# Patient Record
Sex: Female | Born: 1980 | Race: White | Hispanic: No | Marital: Single | Smoking: Former smoker
Health system: Southern US, Community
[De-identification: ages and names within clinical notes are randomized; demographics above are authoritative.]

## PROBLEM LIST (undated history)

## (undated) DIAGNOSIS — E282 Polycystic ovarian syndrome: Secondary | ICD-10-CM

## (undated) DIAGNOSIS — N83519 Torsion of ovary and ovarian pedicle, unspecified side: Secondary | ICD-10-CM

---

## 2009-07-03 DIAGNOSIS — N83519 Torsion of ovary and ovarian pedicle, unspecified side: Secondary | ICD-10-CM

## 2009-07-03 DIAGNOSIS — E282 Polycystic ovarian syndrome: Secondary | ICD-10-CM

## 2009-07-03 HISTORY — PX: OTHER SURGICAL HISTORY: SHX169

## 2009-07-03 HISTORY — DX: Torsion of ovary and ovarian pedicle, unspecified side: N83.519

## 2009-07-03 HISTORY — DX: Polycystic ovarian syndrome: E28.2

## 2017-10-22 ENCOUNTER — Inpatient Hospital Stay (HOSPITAL_COMMUNITY)
Admission: AD | Admit: 2017-10-22 | Discharge: 2017-10-22 | Disposition: A | Discharge status: Home / Self Care | Payer: Federal, State, Local not specified - PPO | Source: Ambulatory Visit | Attending: Obstetrics & Gynecology | Admitting: Obstetrics & Gynecology

## 2017-10-22 ENCOUNTER — Inpatient Hospital Stay (HOSPITAL_COMMUNITY): Payer: Federal, State, Local not specified - PPO

## 2017-10-22 ENCOUNTER — Encounter (HOSPITAL_COMMUNITY): Payer: Self-pay

## 2017-10-22 DIAGNOSIS — Z79899 Other long term (current) drug therapy: Secondary | ICD-10-CM | POA: Diagnosis not present

## 2017-10-22 DIAGNOSIS — N939 Abnormal uterine and vaginal bleeding, unspecified: Secondary | ICD-10-CM | POA: Diagnosis not present

## 2017-10-22 DIAGNOSIS — Z87891 Personal history of nicotine dependence: Secondary | ICD-10-CM | POA: Insufficient documentation

## 2017-10-22 DIAGNOSIS — N83202 Unspecified ovarian cyst, left side: Secondary | ICD-10-CM

## 2017-10-22 DIAGNOSIS — R102 Pelvic and perineal pain: Secondary | ICD-10-CM | POA: Diagnosis not present

## 2017-10-22 DIAGNOSIS — Z7984 Long term (current) use of oral hypoglycemic drugs: Secondary | ICD-10-CM | POA: Diagnosis not present

## 2017-10-22 DIAGNOSIS — N83292 Other ovarian cyst, left side: Secondary | ICD-10-CM | POA: Diagnosis not present

## 2017-10-22 DIAGNOSIS — K5909 Other constipation: Secondary | ICD-10-CM | POA: Diagnosis not present

## 2017-10-22 DIAGNOSIS — Z8742 Personal history of other diseases of the female genital tract: Secondary | ICD-10-CM

## 2017-10-22 DIAGNOSIS — R109 Unspecified abdominal pain: Secondary | ICD-10-CM | POA: Diagnosis present

## 2017-10-22 HISTORY — DX: Polycystic ovarian syndrome: E28.2

## 2017-10-22 HISTORY — DX: Torsion of ovary and ovarian pedicle, unspecified side: N83.519

## 2017-10-22 LAB — WET PREP, GENITAL
Sperm: NONE SEEN
TRICH WET PREP: NONE SEEN
Yeast Wet Prep HPF POC: NONE SEEN

## 2017-10-22 LAB — URINALYSIS, ROUTINE W REFLEX MICROSCOPIC
BILIRUBIN URINE: NEGATIVE
Glucose, UA: NEGATIVE mg/dL
KETONES UR: NEGATIVE mg/dL
NITRITE: NEGATIVE
PROTEIN: NEGATIVE mg/dL
Specific Gravity, Urine: 1.006 (ref 1.005–1.030)
pH: 6 (ref 5.0–8.0)

## 2017-10-22 LAB — CBC
HCT: 39.3 % (ref 36.0–46.0)
HEMOGLOBIN: 13.6 g/dL (ref 12.0–15.0)
MCH: 30.8 pg (ref 26.0–34.0)
MCHC: 34.6 g/dL (ref 30.0–36.0)
MCV: 88.9 fL (ref 78.0–100.0)
PLATELETS: 211 10*3/uL (ref 150–400)
RBC: 4.42 MIL/uL (ref 3.87–5.11)
RDW: 12.1 % (ref 11.5–15.5)
WBC: 7.5 10*3/uL (ref 4.0–10.5)

## 2017-10-22 LAB — POCT PREGNANCY, URINE: PREG TEST UR: NEGATIVE

## 2017-10-22 MED ORDER — TRAMADOL HCL 50 MG PO TABS
100.0000 mg | ORAL_TABLET | Freq: Once | ORAL | Status: AC
  Administered 2017-10-22: 100 mg via ORAL
  Filled 2017-10-22: qty 2

## 2017-10-22 MED ORDER — IBUPROFEN 600 MG PO TABS: 600.0000 mg | ORAL_TABLET | Freq: Four times a day (QID) | ORAL | 1 refills | Status: AC | PRN

## 2017-10-22 MED ORDER — TRAMADOL HCL 50 MG PO TABS: 50.0000 mg | ORAL_TABLET | Freq: Four times a day (QID) | ORAL | 0 refills | Status: AC | PRN

## 2017-10-22 NOTE — MAU Provider Note (Signed)
Chief Complaint: Abdominal Pain   First Provider Initiated Contact with Patient 10/22/17 1550      SUBJECTIVE HPI: Kayla Fuller is a 37 y.o. who presents to maternity admissions reporting vaginal bleeding 2 weeks after her normal period that is associated with severe lower abdominal cramping. She reports her usual regular period was 2 weeks ago, then yesterday she started having light period bleeding again and severe cramping intermittent lower abdominal pain. The pain is more on the left lower side but is all over her low abdomen. She took ibuprofen, 7-8 tablets, today but with no pain relief. She has hx of PCOS with ovary removed 9-10 years ago for torsion.  After her ovary was removed, her periods became regular and she has not had any other gyn problems. She reports some chronic constipation but has never had pain like this. Last bowel movement was today but it was hard and uncomfortable.  There are no other associated symptoms. Pertinent negatives include no n/v, no fever/chills. She has not tried any other treatments.   HPI  Past Medical History:  Diagnosis Date  . Ovarian torsion 2011   right  . PCOS (polycystic ovarian syndrome) 2011   Past Surgical History:  Procedure Laterality Date  . removal of right ovary and fallopian tube Right 2011   Social History   Socioeconomic History  . Marital status: Single    Spouse name: Not on file  . Number of children: Not on file  . Years of education: Not on file  . Highest education level: Not on file  Occupational History  . Not on file  Social Needs  . Financial resource strain: Not on file  . Food insecurity:    Worry: Not on file    Inability: Not on file  . Transportation needs:    Medical: Not on file    Non-medical: Not on file  Tobacco Use  . Smoking status: Former Games developer  . Smokeless tobacco: Never Used  Substance and Sexual Activity  . Alcohol use: Yes    Comment: socially  . Drug use: Never  . Sexual activity:  Yes    Birth control/protection: Condom  Lifestyle  . Physical activity:    Days per week: Not on file    Minutes per session: Not on file  . Stress: Not on file  Relationships  . Social connections:    Talks on phone: Not on file    Gets together: Not on file    Attends religious service: Not on file    Active member of club or organization: Not on file    Attends meetings of clubs or organizations: Not on file    Relationship status: Not on file  . Intimate partner violence:    Fear of current or ex partner: Not on file    Emotionally abused: Not on file    Physically abused: Not on file    Forced sexual activity: Not on file  Other Topics Concern  . Not on file  Social History Narrative  . Not on file   No current facility-administered medications on file prior to encounter.    Current Outpatient Medications on File Prior to Encounter  Medication Sig Dispense Refill  . albuterol (PROVENTIL HFA;VENTOLIN HFA) 108 (90 Base) MCG/ACT inhaler Inhale 1-2 puffs into the lungs every 6 (six) hours as needed for wheezing or shortness of breath.    Marland Kitchen ibuprofen (ADVIL,MOTRIN) 200 MG tablet Take 200 mg by mouth every 6 (six) hours as needed  for headache or cramping.    . metFORMIN (GLUCOPHAGE) 1000 MG tablet Take 500 mg by mouth 2 (two) times daily with a meal.    . sertraline (ZOLOFT) 100 MG tablet Take 100 mg by mouth daily.     No Known Allergies  ROS:  Review of Systems  Constitutional: Negative for chills, fatigue and fever.  Respiratory: Negative for shortness of breath.   Cardiovascular: Negative for chest pain.  Gastrointestinal: Positive for abdominal pain and constipation. Negative for diarrhea, nausea and vomiting.  Genitourinary: Positive for pelvic pain and vaginal bleeding. Negative for difficulty urinating, dysuria, flank pain, vaginal discharge and vaginal pain.  Neurological: Negative for dizziness and headaches.  Psychiatric/Behavioral: Negative.      I have  reviewed patient's Past Medical Hx, Surgical Hx, Family Hx, Social Hx, medications and allergies.   Physical Exam   Patient Vitals for the past 24 hrs:  BP Temp Temp src Pulse Resp SpO2 Height Weight  10/22/17 1315 121/83 99 F (37.2 C) Oral 84 16 98 % 5' (1.524 m) 208 lb (94.3 kg)   Constitutional: Well-developed, well-nourished female in mild distress.  Cardiovascular: normal rate Respiratory: normal effort GI: Abd soft, non-tender. No rebound tenderness or guarding. Pos BS x 4 MS: Extremities nontender, no edema, normal ROM Neurologic: Alert and oriented x 4.  GU: Neg CVAT.  PELVIC EXAM: Cervix pink, visually closed, without lesion, small amount dark red bleeding on exam, vaginal walls and external genitalia normal Bimanual exam: Cervix 0/long/high, firm, anterior, neg CMT, uterus nontender, nonenlarged, adnexa with significant tenderness on left, none on right, no enlargement or mass bilaterally   LAB RESULTS Results for orders placed or performed during the hospital encounter of 10/22/17 (from the past 24 hour(s))  Urinalysis, Routine w reflex microscopic     Status: Abnormal   Collection Time: 10/22/17  1:16 PM  Result Value Ref Range   Color, Urine STRAW (A) YELLOW   APPearance CLEAR CLEAR   Specific Gravity, Urine 1.006 1.005 - 1.030   pH 6.0 5.0 - 8.0   Glucose, UA NEGATIVE NEGATIVE mg/dL   Hgb urine dipstick LARGE (A) NEGATIVE   Bilirubin Urine NEGATIVE NEGATIVE   Ketones, ur NEGATIVE NEGATIVE mg/dL   Protein, ur NEGATIVE NEGATIVE mg/dL   Nitrite NEGATIVE NEGATIVE   Leukocytes, UA MODERATE (A) NEGATIVE   RBC / HPF 0-5 0 - 5 RBC/hpf   WBC, UA 6-30 0 - 5 WBC/hpf   Bacteria, UA RARE (A) NONE SEEN   Squamous Epithelial / LPF 0-5 (A) NONE SEEN   Mucus PRESENT   Pregnancy, urine POC     Status: None   Collection Time: 10/22/17  1:45 PM  Result Value Ref Range   Preg Test, Ur NEGATIVE NEGATIVE  Wet prep, genital     Status: Abnormal   Collection Time: 10/22/17  3:27  PM  Result Value Ref Range   Yeast Wet Prep HPF POC NONE SEEN NONE SEEN   Trich, Wet Prep NONE SEEN NONE SEEN   Clue Cells Wet Prep HPF POC PRESENT (A) NONE SEEN   WBC, Wet Prep HPF POC MANY (A) NONE SEEN   Sperm NONE SEEN   CBC     Status: None   Collection Time: 10/22/17  3:38 PM  Result Value Ref Range   WBC 7.5 4.0 - 10.5 K/uL   RBC 4.42 3.87 - 5.11 MIL/uL   Hemoglobin 13.6 12.0 - 15.0 g/dL   HCT 40.9 81.1 - 91.4 %   MCV  88.9 78.0 - 100.0 fL   MCH 30.8 26.0 - 34.0 pg   MCHC 34.6 30.0 - 36.0 g/dL   RDW 11.912.1 14.711.5 - 82.915.5 %   Platelets 211 150 - 400 K/uL       IMAGING [place] Pelvic Complete W Transvaginal And Torsion R/o  Result Date: 10/22/2017 CLINICAL DATA:  Pelvic pain and vaginal bleeding. History of prior right oophorectomy for torsion. History of polycystic ovary disease. EXAM: TRANSABDOMINAL AND TRANSVAGINAL ULTRASOUND OF PELVIS TECHNIQUE: Both transabdominal and transvaginal ultrasound examinations of the pelvis were performed. Transabdominal technique was performed for global imaging of the pelvis including uterus, ovaries, adnexal regions, and pelvic cul-de-sac. It was necessary to proceed with endovaginal exam following the transabdominal exam to visualize the left ovary and uterus. COMPARISON:  None FINDINGS: Uterus Measurements: 8.1 x 4.3 x 4.9 cm. Single probable small anterior fibroid measuring 1.2 cm. Endometrium Thickness: 6 mm.  No focal abnormality visualized. Right ovary Removed. Left ovary Measurements: 5.6 x 2.4 x 3.1 cm. Multiple cysts including irregular complex cyst measuring approximately 1.9 x 1.7 x 2.1 cm. This may represent a hemorrhagic or ruptured cyst based on appearance. Blood flow is present to the left ovary by color Doppler. Other findings Small amount of free fluid in the pelvis adjacent to the left ovary. IMPRESSION: Irregular complex 2 cm cyst of the left ovary which may represent a hemorrhagic or ruptured cyst. There is some associated free fluid  adjacent to the left ovary which may implicate cyst rupture. Electronically Signed   By: Irish LackGlenn  Yamagata M.D.   On: 10/22/2017 18:27    MAU Management/MDM: Ordered labs and reviewed results.  CBC with no elevated WBCs, UA with hemoglobin likely from vaginal bleeding and WBCs so will send for culture. Most likely anovulatory cycle and/or left ovarian cyst causing AUB and pain. Pain on pelvic exam so pt sent for MAU US which rules out torsion but notes a 2 cm hemorrhagic/ruptured left ovarian cyst.  Will continue pain management and [facility]    Tramadol 100 mg given in MAU with some pain relief. Rx for ibuprofen 600 mg Q 6 hours and Tramadol 50 mg Q 6 hours PRN x 6 tabs. Return to MAU as needed for emergencies.  Pt discharged with strict bleeding/pain precautions.  ASSESSMENT 1. Ovarian cyst, left   2. Personal history of ovarian cyst   3. Acute pelvic pain, female     PLAN Discharge home Allergies as of 10/22/2017   No Known Allergies     Medication List    TAKE these medications   albuterol 108 (90 Base) MCG/ACT inhaler Commonly known as:  PROVENTIL HFA;VENTOLIN HFA Inhale 1-2 puffs into the lungs every 6 (six) hours as needed for wheezing or shortness of breath.   ibuprofen 600 MG tablet Commonly known as:  ADVIL,MOTRIN Take 1 tablet (600 mg total) by mouth every 6 (six) hours as needed. What changed:    medication strength  how much to take  reasons to take this   metFORMIN 1000 MG tablet Commonly known as:  GLUCOPHAGE Take 500 mg by mouth 2 (two) times daily with a meal.   sertraline 100 MG tablet Commonly known as:  ZOLOFT Take 100 mg by mouth daily.   traMADol 50 MG tablet Commonly known as:  ULTRAM Take 1-2 tablets (50-100 mg total) by mouth every 6 (six) hours as needed.      Follow-up Information    [facility].   Specialty:  Obstetrics and Gynecology Why:  The office will call you with  follow up appointment. Return to MAU as needed for emergencies. Contact information: [address] [place] [address] [phone]          Sharen Counter Certified Nurse-Midwife 10/22/2017  7:19 PM

## 2017-10-22 NOTE — MAU Note (Signed)
Pt reports she has been cramping and have some bleeding. Had a period 2 weeks ago but here today more because of the cramping. States she is taking a lot of ibuprofen and it isn't helping.

## 2017-10-23 ENCOUNTER — Telehealth: Payer: Self-pay | Admitting: General Practice

## 2017-10-23 LAB — URINE CULTURE

## 2017-10-23 LAB — GC/CHLAMYDIA PROBE AMP (~~LOC~~) NOT AT ARMC
Chlamydia: POSITIVE — AB
Neisseria Gonorrhea: NEGATIVE

## 2017-10-23 NOTE — Telephone Encounter (Signed)
Called and notified patient of appointment on 12/12/17 at 3:55pm.  Patient voiced understanding.

## 2017-10-24 ENCOUNTER — Telehealth: Payer: Self-pay | Admitting: Medical

## 2017-10-24 DIAGNOSIS — A749 Chlamydial infection, unspecified: Secondary | ICD-10-CM

## 2017-10-24 MED ORDER — AZITHROMYCIN 250 MG PO TABS: 1000.0000 mg | ORAL_TABLET | Freq: Once | ORAL | 0 refills | Status: AC

## 2017-10-24 NOTE — Telephone Encounter (Addendum)
Kayla RiceMaranda Fuller tested positive for  Chlamydia. Patient was called by RN and allergies and pharmacy confirmed. Rx sent to pharmacy of choice.   Kayla Fuller, Kayla N, PA-C 10/24/2017 11:29 AM      ----- Message from Kathe BectonLori S Berdik, RN sent at 10/24/2017 11:22 AM EDT ----- This patient tested positive for :  chlamydia  She :"has NKDA", I have informed the patient of her results and confirmed her pharmacy is correct in her chart. Please send Rx.   Thank you,   Kathe BectonBerdik, Lori S, RN   [facility]

## 2017-12-12 ENCOUNTER — Encounter: Payer: Federal, State, Local not specified - PPO | Admitting: Obstetrics & Gynecology

## 2019-04-24 IMAGING — US US PELV - US TRANSVAGINAL
1 series · 15 of 25 positions shown · non-contrast
Comparison: None

CLINICAL DATA: Pelvic pain and vaginal bleeding. History of prior
right oophorectomy for torsion. History of polycystic ovary disease.

EXAM:
TRANSABDOMINAL AND TRANSVAGINAL ULTRASOUND OF PELVIS
TECHNIQUE: Both transabdominal and transvaginal ultrasound examinations of the
pelvis were performed. Transabdominal technique was performed for
global imaging of the pelvis including uterus, ovaries, adnexal
regions, and pelvic cul-de-sac. It was necessary to proceed with
endovaginal exam following the transabdominal exam to visualize the
left ovary and uterus.

[Series 1: us pelv - us transvaginal · 74 acquisitions, 15 frames shown]
[im 1/74]
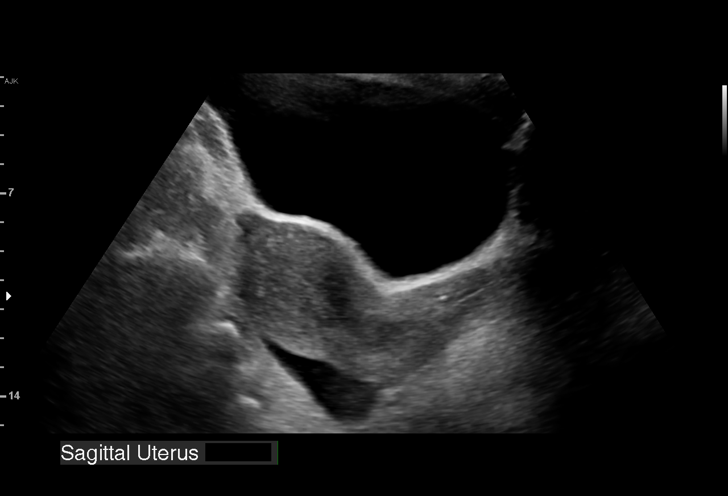
[im 7/74]
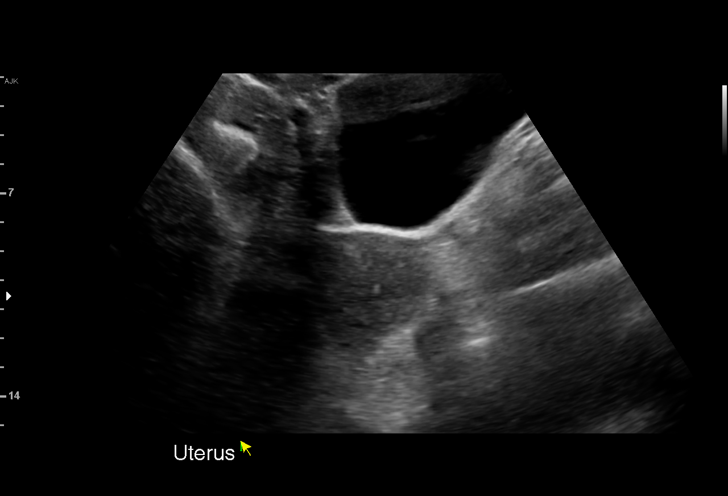
[im 13/74]
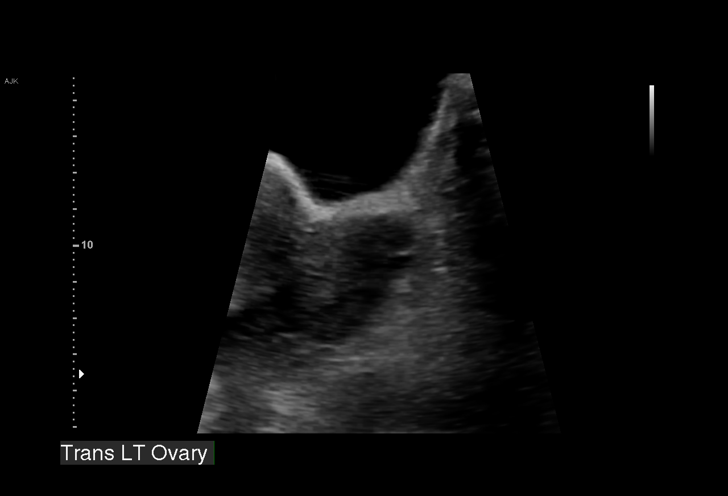
[im 16/74]
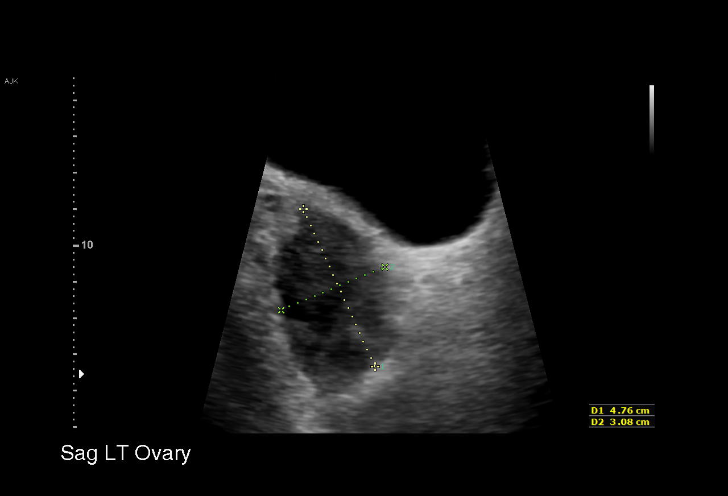
[im 22/74]
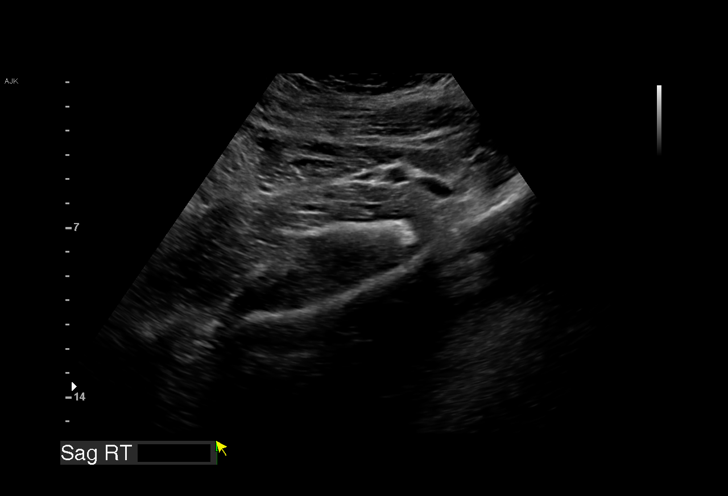
[im 28/74]
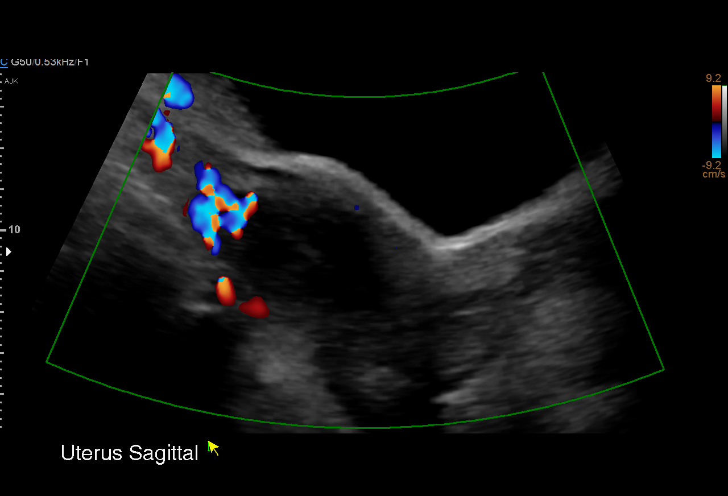
[im 31/74]
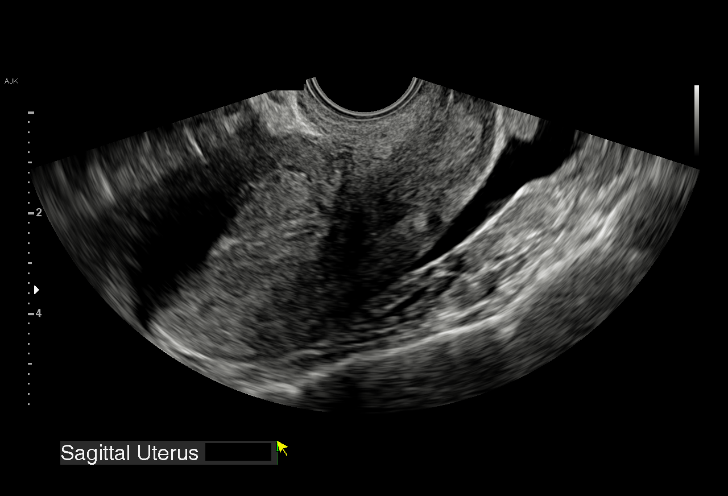
[im 37/74]
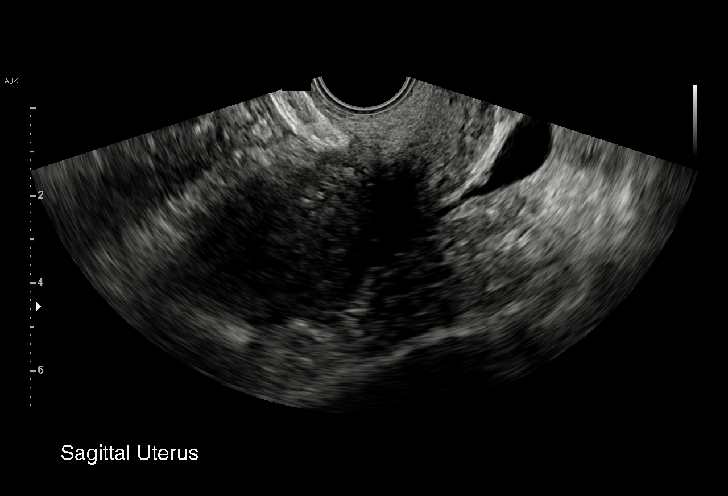
[im 43/74]
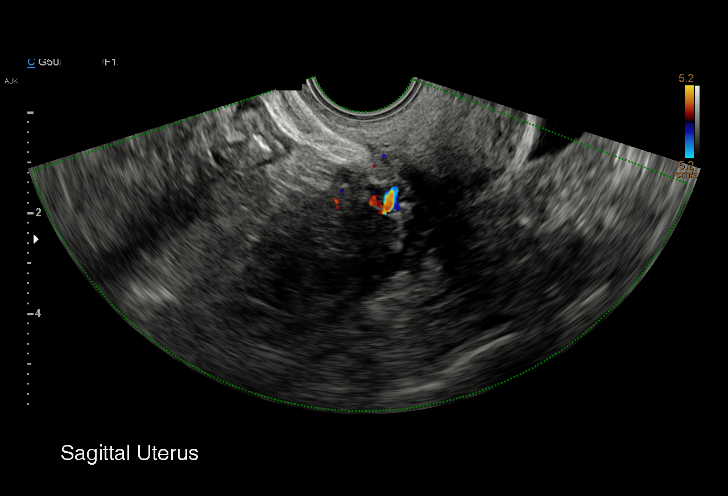
[im 46/74]
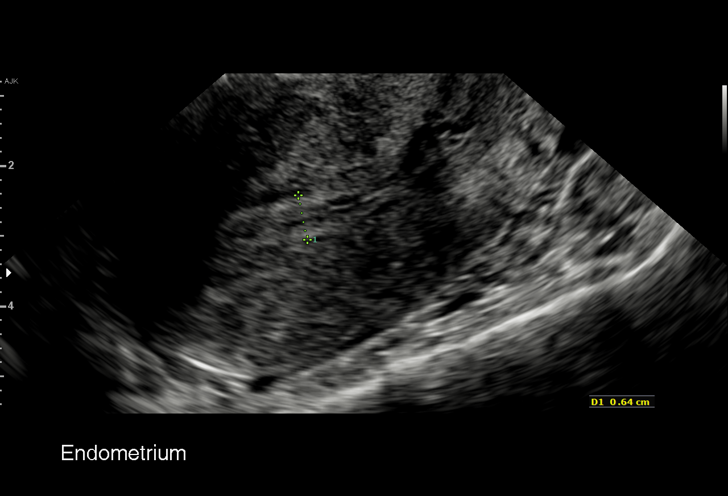
[im 52/74]
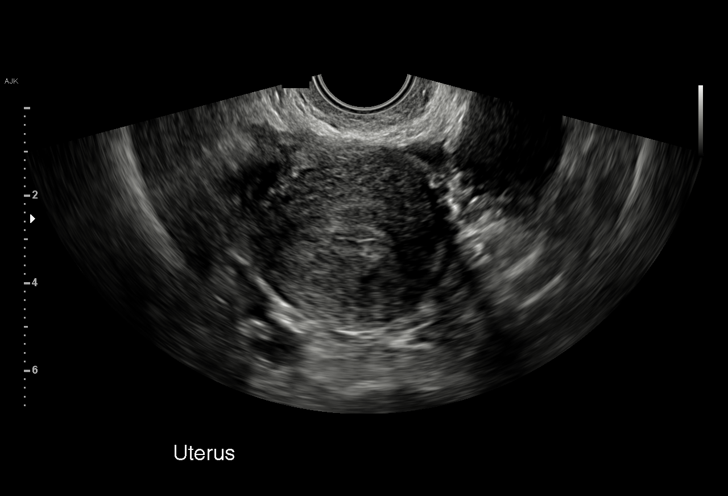
[im 58/74]
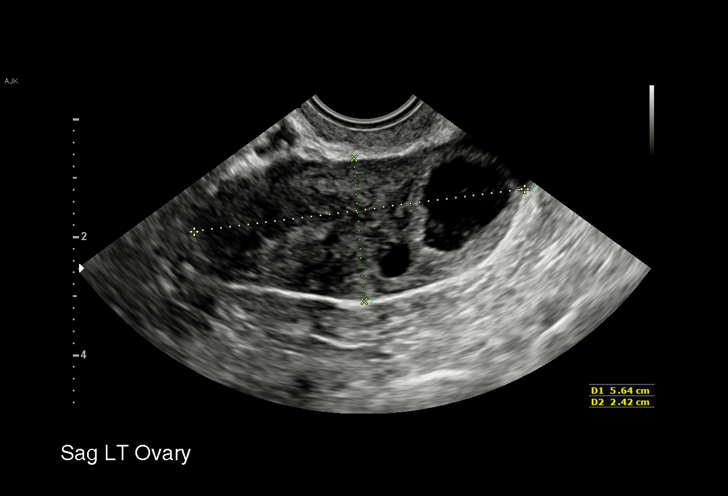
[im 61/74]
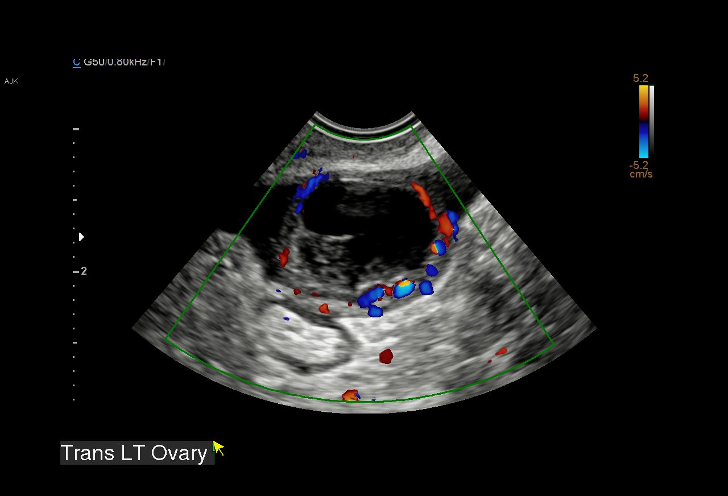
[im 67/74]
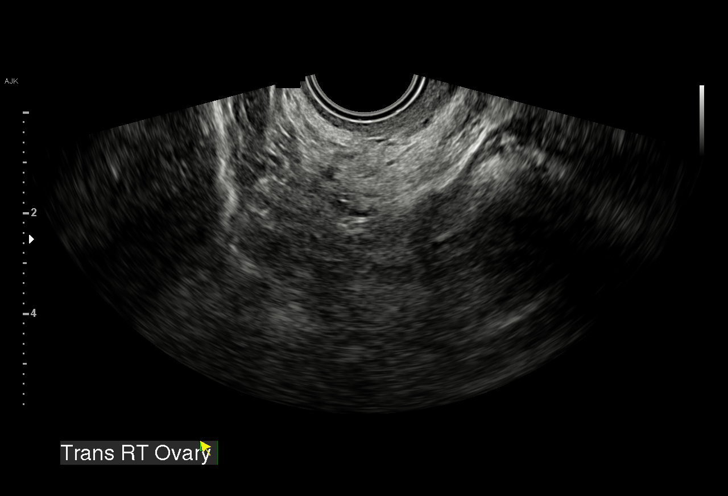
[im 74/74]
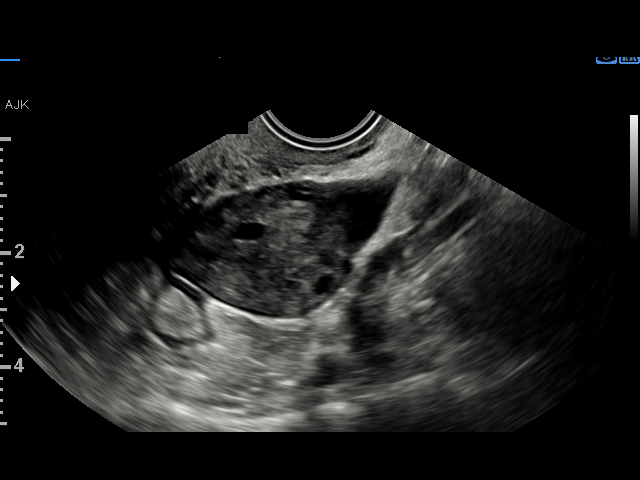

[15 of 25 positions shown; findings below may reference images not displayed]

FINDINGS: Uterus

Measurements: 8.1 x 4.3 x 4.9 cm. Single probable small anterior
fibroid measuring 1.2 cm.

Endometrium

Thickness: 6 mm.  No focal abnormality visualized.

Right ovary

Removed.

Left ovary

Measurements: 5.6 x 2.4 x 3.1 cm. Multiple cysts including irregular
complex cyst measuring approximately 1.9 x 1.7 x 2.1 cm. This may
represent a hemorrhagic or ruptured cyst based on appearance. Blood
flow is present to the left ovary by color Doppler.

Other findings

Small amount of free fluid in the pelvis adjacent to the left ovary.
IMPRESSION: Irregular complex 2 cm cyst of the left ovary which may represent a
hemorrhagic or ruptured cyst. There is some associated free fluid
adjacent to the left ovary which may implicate cyst rupture.
